# Patient Record
Sex: Male | Born: 1990 | Race: White | Hispanic: No | Marital: Single | Smoking: Never smoker
Health system: Southern US, Community
[De-identification: ages and names within clinical notes are randomized; demographics above are authoritative.]

## PROBLEM LIST (undated history)

## (undated) HISTORY — PX: WISDOM TOOTH EXTRACTION: SHX21

---
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
## 2009-06-01 ENCOUNTER — Emergency Department (HOSPITAL_COMMUNITY): Admission: EM | Admit: 2009-06-01 | Discharge: 2009-06-01 | Payer: Self-pay | Admitting: Family Medicine

## 2010-04-07 ENCOUNTER — Emergency Department (HOSPITAL_COMMUNITY): Admission: EM | Admit: 2010-04-07 | Discharge: 2010-04-07 | Payer: Self-pay | Admitting: Family Medicine

## 2015-02-04 ENCOUNTER — Encounter (HOSPITAL_COMMUNITY): Payer: Self-pay | Admitting: *Deleted

## 2015-02-04 ENCOUNTER — Emergency Department (HOSPITAL_COMMUNITY)
Admission: EM | Admit: 2015-02-04 | Discharge: 2015-02-05 | Disposition: A | Discharge status: Home / Self Care | Payer: BLUE CROSS/BLUE SHIELD | Attending: Emergency Medicine | Admitting: Emergency Medicine

## 2015-02-04 DIAGNOSIS — Y998 Other external cause status: Secondary | ICD-10-CM | POA: Insufficient documentation

## 2015-02-04 DIAGNOSIS — Y9339 Activity, other involving climbing, rappelling and jumping off: Secondary | ICD-10-CM | POA: Insufficient documentation

## 2015-02-04 DIAGNOSIS — S0181XA Laceration without foreign body of other part of head, initial encounter: Secondary | ICD-10-CM | POA: Diagnosis not present

## 2015-02-04 DIAGNOSIS — Y9289 Other specified places as the place of occurrence of the external cause: Secondary | ICD-10-CM | POA: Insufficient documentation

## 2015-02-04 DIAGNOSIS — W01198A Fall on same level from slipping, tripping and stumbling with subsequent striking against other object, initial encounter: Secondary | ICD-10-CM | POA: Diagnosis not present

## 2015-02-04 DIAGNOSIS — Z23 Encounter for immunization: Secondary | ICD-10-CM | POA: Insufficient documentation

## 2015-02-04 MED ORDER — TETANUS-DIPHTH-ACELL PERTUSSIS 5-2.5-18.5 LF-MCG/0.5 IM SUSP
0.5000 mL | Freq: Once | INTRAMUSCULAR | Status: AC
  Administered 2015-02-04: 0.5 mL via INTRAMUSCULAR
  Filled 2015-02-04: qty 0.5

## 2015-02-04 MED ORDER — LIDOCAINE-EPINEPHRINE 2 %-1:100000 IJ SOLN
20.0000 mL | Freq: Once | INTRAMUSCULAR | Status: AC
  Administered 2015-02-05: 1 mL
  Filled 2015-02-04: qty 1

## 2015-02-04 MED ORDER — IBUPROFEN 800 MG PO TABS: 800.0000 mg | ORAL_TABLET | Freq: Once | ORAL | Status: DC

## 2015-02-04 MED ORDER — BACITRACIN ZINC 500 UNIT/GM EX OINT: 1.0000 "application " | TOPICAL_OINTMENT | Freq: Two times a day (BID) | CUTANEOUS | Status: DC

## 2015-02-04 NOTE — ED Provider Notes (Signed)
CSN: [id number]     Arrival date & time 02/04/15  2205 History  This chart was scribed for non-physician practitioner, Antony MaduraKelly Omauri Boeve, PA-C working with Gerhard Munchobert Lockwood, MD, by Jarvis Morganaylor Ferguson, ED Scribe. This patient was seen in room WTR7/WTR7 and the patient's care was started at 10:39 PM.    Chief Complaint  Patient presents with  . Head Laceration   Patient is a 24 y.o. male presenting with scalp laceration. The history is provided by the patient. No language interpreter was used.  Head Laceration This is a new problem. The current episode started 1 to 2 hours ago. The problem occurs rarely. The problem has not changed since onset.Pertinent negatives include no headaches. Nothing aggravates the symptoms. Nothing relieves the symptoms. He has tried nothing for the symptoms.    HPI Comments: Jared Dickerson is a 24 y.o. male who presents to the Emergency Department complaining of a head laceration that occurred PTA. Pt states he was trying to get into his house and was climbing over a fence and he fell and cut the right side of his forehead on the top of the fence. Pt states his last tetanus was more than 5 years ago. He denies any fall. There is no active bleeding at this time. He denies any pain at this time.    History reviewed. No pertinent past medical history. Past Surgical History  Procedure Laterality Date  . Wisdom tooth extraction     No family history on file. History  Substance Use Topics  . Smoking status: Never Smoker   . Smokeless tobacco: Not on file  . Alcohol Use: Yes     Comment: occasional    Review of Systems  Skin: Positive for wound (right forehead).  Neurological: Negative for headaches.    Allergies  Review of patient's allergies indicates no known allergies.  Home Medications   Prior to Admission medications   Medication Sig Start Date End Date Taking? Authorizing Provider  bacitracin ointment Apply 1 application topically 2 (two) times daily. 02/04/15    Antony MaduraKelly Mattew Chriswell, PA-C   Triage Vitals: BP 149/73 mmHg  Pulse 73  Temp(Src) 98 F (36.7 C) (Oral)  Resp 18  SpO2 100%  Physical Exam  Constitutional: He is oriented to person, place, and time. He appears well-developed and well-nourished. No distress.  HENT:  Head: Normocephalic. Head is with contusion and with laceration. Head is without raccoon's eyes and without Battle's sign.    2.5 cm laceration to R forehead. Bleeding controlled.  Eyes: Conjunctivae and EOM are normal. Pupils are equal, round, and reactive to light. No scleral icterus.  Neck: Normal range of motion.  Pulmonary/Chest: Effort normal. No respiratory distress.  Musculoskeletal: Normal range of motion.  Neurological: He is alert and oriented to person, place, and time. No cranial nerve deficit. He exhibits normal muscle tone. Coordination normal.  GCS 15. No focal neurologic deficits appreciated. Patient ambulatory with steady gait.  Skin: Skin is warm and dry. No rash noted. He is not diaphoretic. No erythema. No pallor.  Psychiatric: He has a normal mood and affect. His behavior is normal.  Nursing note and vitals reviewed.   ED Course  Procedures (including critical care time)  DIAGNOSTIC STUDIES: Oxygen Saturation is 100% on RA, normal by my interpretation.    COORDINATION OF CARE: 10:49 PM  LACERATION REPAIR Performed by: Awilda BillErin Kimble, PA-S Consent: Verbal consent obtained. Risks and benefits: risks, benefits and alternatives were discussed Patient identity confirmed: provided demographic data Time out performed  prior to procedure Prepped and Draped in normal sterile fashion Wound explored Laceration Location: right forehead  Laceration Length: 2.5 cm No Foreign Bodies seen or palpated Anesthesia: local infiltration Local anesthetic: lidocaine 2 % w/ epinephrine Anesthetic total: 3 ml Irrigation method: syringe Amount of cleaning: standard Skin closure: 5-0 ethilon Number of sutures or staples:  5 Technique: simple interrupted Patient tolerance: Patient tolerated the procedure well with no immediate complications.  Labs Review Labs Reviewed - No data to display  Imaging Review No results found.   EKG Interpretation None      MDM   Final diagnoses:  Forehead laceration, initial encounter    Tdap booster given. Pressure irrigation performed. Laceration occurred < 8 hours prior to repair which was well tolerated. Pt has no comorbidities to effect normal wound healing. Discussed suture home care with pt and answered questions. Pt to follow up for wound check and suture removal in 5-7 days. Patient is hemodynamically stable with no complaints prior to discharge.    I personally performed the services described in this documentation, which was scribed in my presence. The recorded information has been reviewed and is accurate.   Filed Vitals:   02/04/15 2216  BP: 149/73  Pulse: 73  Temp: 98 F (36.7 C)  TempSrc: Oral  Resp: 18  SpO2: 100%       Antony Madura, PA-C 02/05/15 0154  Gerhard Munch, MD 02/08/15 1046

## 2015-02-04 NOTE — ED Notes (Signed)
Pt reports he jumped over a fence to get in his house tonight.  A metal piece was sticking out sticking him in his forehead.

## 2015-02-04 NOTE — Discharge Instructions (Signed)
Facial Laceration ° A facial laceration is a cut on the face. These injuries can be painful and cause bleeding. Lacerations usually heal quickly, but they need special care to reduce scarring. °DIAGNOSIS  °Your health care provider will take a medical history, ask for details about how the injury occurred, and examine the wound to determine how deep the cut is. °TREATMENT  °Some facial lacerations may not require closure. Others may not be able to be closed because of an increased risk of infection. The risk of infection and the chance for successful closure will depend on various factors, including the amount of time since the injury occurred. °The wound may be cleaned to help prevent infection. If closure is appropriate, pain medicines may be given if needed. Your health care provider will use stitches (sutures), wound glue (adhesive), or skin adhesive strips to repair the laceration. These tools bring the skin edges together to allow for faster healing and a better cosmetic outcome. If needed, you may also be given a tetanus shot. °HOME CARE INSTRUCTIONS °· Only take over-the-counter or prescription medicines as directed by your health care provider. °· Follow your health care provider's instructions for wound care. These instructions will vary depending on the technique used for closing the wound. °For Sutures: °· Keep the wound clean and dry.   °· If you were given a bandage (dressing), you should change it at least once a day. Also change the dressing if it becomes wet or dirty, or as directed by your health care provider.   °· Wash the wound with soap and water 2 times a day. Rinse the wound off with water to remove all soap. Pat the wound dry with a clean towel.   °· After cleaning, apply a thin layer of the antibiotic ointment recommended by your health care provider. This will help prevent infection and keep the dressing from sticking.   °· You may shower as usual after the first 24 hours. Do not soak the  wound in water until the sutures are removed.   °· Get your sutures removed as directed by your health care provider. With facial lacerations, sutures should usually be taken out after 4-5 days to avoid stitch marks.   °· Wait a few days after your sutures are removed before applying any makeup. °For Skin Adhesive Strips: °· Keep the wound clean and dry.   °· Do not get the skin adhesive strips wet. You may bathe carefully, using caution to keep the wound dry.   °· If the wound gets wet, pat it dry with a clean towel.   °· Skin adhesive strips will fall off on their own. You may trim the strips as the wound heals. Do not remove skin adhesive strips that are still stuck to the wound. They will fall off in time.   °For Wound Adhesive: °· You may briefly wet your wound in the shower or bath. Do not soak or scrub the wound. Do not swim. Avoid periods of heavy sweating until the skin adhesive has fallen off on its own. After showering or bathing, gently pat the wound dry with a clean towel.   °· Do not apply liquid medicine, cream medicine, ointment medicine, or makeup to your wound while the skin adhesive is in place. This may loosen the film before your wound is healed.   °· If a dressing is placed over the wound, be careful not to apply tape directly over the skin adhesive. This may cause the adhesive to be pulled off before the wound is healed.   °· Avoid   prolonged exposure to sunlight or tanning lamps while the skin adhesive is in place.  The skin adhesive will usually remain in place for 5-10 days, then naturally fall off the skin. Do not pick at the adhesive film.  After Healing: Once the wound has healed, cover the wound with sunscreen during the day for 1 full year. This can help minimize scarring. Exposure to ultraviolet light in the first year will darken the scar. It can take 1-2 years for the scar to lose its redness and to heal completely.  SEEK IMMEDIATE MEDICAL CARE IF:  You have redness, pain, or  swelling around the wound.   You see ayellowish-white fluid (pus) coming from the wound.   You have chills or a fever.  MAKE SURE YOU:  Understand these instructions.  Will watch your condition.  Will get help right away if you are not doing well or get worse. Document Released: 11/16/2004 Document Revised: 07/30/2013 Document Reviewed: 05/22/2013 [facility] Patient Information 2015 [employer], [place]. This information is not intended to replace advice given to you by your health care provider. Make sure you discuss any questions you have with your health care provider.  Laceration Care, Adult A laceration is a cut or lesion that goes through all layers of the skin and into the tissue just beneath the skin. TREATMENT  Some lacerations may not require closure. Some lacerations may not be able to be closed due to an increased risk of infection. It is important to see your caregiver as soon as possible after an injury to minimize the risk of infection and maximize the opportunity for successful closure. If closure is appropriate, pain medicines may be given, if needed. The wound will be cleaned to help prevent infection. Your caregiver will use stitches (sutures), staples, wound glue (adhesive), or skin adhesive strips to repair the laceration. These tools bring the skin edges together to allow for faster healing and a better cosmetic outcome. However, all wounds will heal with a scar. Once the wound has healed, scarring can be minimized by covering the wound with sunscreen during the day for 1 full year. HOME CARE INSTRUCTIONS  For sutures or staples:  Keep the wound clean and dry.  If you were given a bandage (dressing), you should change it at least once a day. Also, change the dressing if it becomes wet or dirty, or as directed by your caregiver.  Wash the wound with soap and water 2 times a day. Rinse the wound off with water to remove all soap. Pat the wound dry with a clean  towel.  After cleaning, apply a thin layer of the antibiotic ointment as recommended by your caregiver. This will help prevent infection and keep the dressing from sticking.  You may shower as usual after the first 24 hours. Do not soak the wound in water until the sutures are removed.  Only take over-the-counter or prescription medicines for pain, discomfort, or fever as directed by your caregiver.  Get your sutures or staples removed as directed by your caregiver. For skin adhesive strips:  Keep the wound clean and dry.  Do not get the skin adhesive strips wet. You may bathe carefully, using caution to keep the wound dry.  If the wound gets wet, pat it dry with a clean towel.  Skin adhesive strips will fall off on their own. You may trim the strips as the wound heals. Do not remove skin adhesive strips that are still stuck to the wound. They will fall  off in time. For wound adhesive:  You may briefly wet your wound in the shower or bath. Do not soak or scrub the wound. Do not swim. Avoid periods of heavy perspiration until the skin adhesive has fallen off on its own. After showering or bathing, gently pat the wound dry with a clean towel.  Do not apply liquid medicine, cream medicine, or ointment medicine to your wound while the skin adhesive is in place. This may loosen the film before your wound is healed.  If a dressing is placed over the wound, be careful not to apply tape directly over the skin adhesive. This may cause the adhesive to be pulled off before the wound is healed.  Avoid prolonged exposure to sunlight or tanning lamps while the skin adhesive is in place. Exposure to ultraviolet light in the first year will darken the scar.  The skin adhesive will usually remain in place for 5 to 10 days, then naturally fall off the skin. Do not pick at the adhesive film. You may need a tetanus shot if:  You cannot remember when you had your last tetanus shot.  You have never had a  tetanus shot. If you get a tetanus shot, your arm may swell, get red, and feel warm to the touch. This is common and not a problem. If you need a tetanus shot and you choose not to have one, there is a rare chance of getting tetanus. Sickness from tetanus can be serious. SEEK MEDICAL CARE IF:   You have redness, swelling, or increasing pain in the wound.  You see a red line that goes away from the wound.  You have yellowish-white fluid (pus) coming from the wound.  You have a fever.  You notice a bad smell coming from the wound or dressing.  Your wound breaks open before or after sutures have been removed.  You notice something coming out of the wound such as wood or glass.  Your wound is on your hand or foot and you cannot move a finger or toe. SEEK IMMEDIATE MEDICAL CARE IF:   Your pain is not controlled with prescribed medicine.  You have severe swelling around the wound causing pain and numbness or a change in color in your arm, hand, leg, or foot.  Your wound splits open and starts bleeding.  You have worsening numbness, weakness, or loss of function of any joint around or beyond the wound.  You develop painful lumps near the wound or on the skin anywhere on your body. MAKE SURE YOU:   Understand these instructions.  Will watch your condition.  Will get help right away if you are not doing well or get worse. Document Released: 10/09/2005 Document Revised: 01/01/2012 Document Reviewed: 04/04/2011 [facility] Patient Information 2015 [employer], [place]. This information is not intended to replace advice given to you by your health care provider. Make sure you discuss any questions you have with your health care provider.

## 2015-06-24 ENCOUNTER — Encounter: Payer: Self-pay | Admitting: Physician Assistant

## 2015-06-24 ENCOUNTER — Ambulatory Visit (INDEPENDENT_AMBULATORY_CARE_PROVIDER_SITE_OTHER): Payer: BLUE CROSS/BLUE SHIELD | Admitting: Physician Assistant

## 2015-06-24 VITALS — BP 131/81 | HR 58 | Temp 98.3°F | Ht 73.0 in | Wt 168.0 lb

## 2015-06-24 DIAGNOSIS — S0181XA Laceration without foreign body of other part of head, initial encounter: Secondary | ICD-10-CM

## 2015-06-24 NOTE — Patient Instructions (Signed)
Tissue Adhesive Wound Care  Some cuts and wounds can be closed with tissue adhesive. Adhesive is like glue. It holds the skin together and helps a wound heal faster. This adhesive goes away on its own as the wound heals.   HOME CARE    Showers are allowed. Do not soak the wound in water. Do not take baths, swim, or use hot tubs. Do not use soaps or creams on your wound.   If a bandage (dressing) was put on, change it as often as told by your doctor.   Keep the bandage dry.   Do not scratch, pick, or rub the adhesive.   Do not put tape over the adhesive. The adhesive could come off.   Protect the wound from another injury.   Protect the wound from sun and tanning beds.   Only take medicine as told by your doctor.   Keep all doctor visits as told.  GET HELP RIGHT AWAY IF:    Your wound is red, puffy (swollen), hot, or tender.   You get a rash after the glue is put on.   You have more pain in the wound.   You have a red streak going away from the wound.   You have yellowish-white fluid (pus) coming from the wound.   You have more bleeding.   You have a fever.   You have chills and start to shake.   You notice a bad smell coming from the wound.   Your wound or adhesive breaks open.  MAKE SURE YOU:    Understand these instructions.   Will watch your condition.   Will get help right away if you are not doing well or get worse.  Document Released: 07/18/2008 Document Revised: 07/30/2013 Document Reviewed: 04/30/2013  [employer] Patient Information 2015 [employer]. This information is not intended to replace advice given to you by your health care provider. Make sure you discuss any questions you have with your health care provider.

## 2015-06-24 NOTE — Progress Notes (Signed)
   Subjective:    Patient ID: Jared Dickerson, male    DOB: 21-Jan-1991, 24 y.o.   MRN: [id number]  HPI 24 y/o male presents with c/o laceration on chin that occurred while wake boarding yesterday. No co-morbidities.     Review of Systems  Constitutional: Negative.   HENT: Negative.   Eyes: Negative.   Respiratory: Negative.   Cardiovascular: Negative.   Gastrointestinal: Negative.   Endocrine: Negative.   Genitourinary: Negative.   Musculoskeletal: Negative.   Skin: Positive for wound (2 inch laceration on submental region of chin ).       Objective:   Physical Exam  Skin:  2 inch clean laceration on submental region of chin. Edges are in line. No missing tissue.  Minimal erythema and edema           Assessment & Plan:  1. Laceration of chin, initial encounter - Cleaned and dermabond applied in office. Instructions were given on dermabond care. Patient will f/u if problems arise   RTO prn   Belissa Kooy A. Chauncey Reading PA-C

## 2015-07-11 ENCOUNTER — Encounter: Payer: Self-pay | Admitting: Physician Assistant

## 2015-09-21 ENCOUNTER — Ambulatory Visit (INDEPENDENT_AMBULATORY_CARE_PROVIDER_SITE_OTHER): Payer: BLUE CROSS/BLUE SHIELD | Admitting: Family

## 2015-09-21 ENCOUNTER — Ambulatory Visit (INDEPENDENT_AMBULATORY_CARE_PROVIDER_SITE_OTHER): Payer: BLUE CROSS/BLUE SHIELD

## 2015-09-21 ENCOUNTER — Encounter: Payer: Self-pay | Admitting: Family

## 2015-09-21 VITALS — BP 137/81 | HR 72 | Temp 98.1°F | Ht 73.0 in | Wt 172.0 lb

## 2015-09-21 DIAGNOSIS — M25512 Pain in left shoulder: Secondary | ICD-10-CM | POA: Diagnosis not present

## 2015-09-21 DIAGNOSIS — S43102A Unspecified dislocation of left acromioclavicular joint, initial encounter: Secondary | ICD-10-CM
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
## 2015-09-21 NOTE — Patient Instructions (Signed)
Acromioclavicular Separation With Rehab The acromioclavicular joint is the joint between the roof of the shoulder (acromion) and the collarbone (clavicle). It is vulnerable to injury. An acromioclavicular [facility]) separation is a partial or complete tear (sprain), injury, or redness and soreness (inflammation) of the ligaments that cross the acromioclavicular joint and hold it in place. There are two ligaments in this area that are vulnerable to injury, the acromioclavicular ligament and the coracoclavicular ligament. SYMPTOMS   Tenderness and swelling, or a bump on top of the shoulder (at the [facility] joint).  Bruising (contusion) in the area within 48 hours of injury.  Loss of strength or pain when reaching over the head or across the body. CAUSES  AC separation is caused by direct trauma to the joint (falling on your shoulder) or indirect trauma (falling on an outstretched arm). RISK INCREASES WITH:  Sports that require contact or collision, throwing sports (i.e. racquetball, squash).  Poor strength and flexibility.  Previous shoulder sprain or dislocation.  Poorly fitted or padded protective equipment. PREVENTION   Warm-up and stretch properly before activity.  Maintain physical fitness:  Shoulder strength.  Shoulder flexibility.  Cardiovascular fitness.  Wear properly fitted and padded protective equipment.  Learn and use proper technique when playing sports. Have a coach correct improper technique, including falling and landing.  Apply taping, protective strapping or padding, or an adhesive bandage as recommended before practice or competition. PROGNOSIS   If treated properly, the symptoms of AC separation can be expected to go away.  If treated improperly, permanent disability may occur unless surgery is performed.  Healing time varies with type of sport and position, arm injured (dominant versus non-dominant) and severity of sprain. RELATED COMPLICATIONS  Weakness and  fatigue of the arm or shoulder are possible but uncommon.  Pain and inflammation of the [facility] joint may continue.  Prolonged healing time may be necessary if usual activities are resumed too early. This causes a susceptibility to recurrent injury.  Prolonged disability may occur.  The shoulder may remain unstable or arthritic following repeated injury. TREATMENT  Treatment initially involves ice and medication to help reduce pain and inflammation. It may also be necessary to modify your activities in order to prevent further injury. Both non-surgical and surgical interventions exist to treat AC separation. Non-surgical intervention is usually recommended and involves wearing a sling to immobilize the joint for a period of time to allow for healing. Surgical intervention is usually only considered for severe sprains of the ligament or for individuals who do not improve after 2 to 6 months of non-surgical treatment. Surgical interventions require 4 to 6 months before a return to sports is possible. MEDICATION  If pain medication is necessary, nonsteroidal anti-inflammatory medications, such as aspirin and ibuprofen, or other minor pain relievers, such as acetaminophen, are often recommended.  Do not take pain medication for 7 days before surgery.  Prescription pain relievers may be given by your caregiver. Use only as directed and only as much as you need.  Ointments applied to the skin may be helpful.  Corticosteroid injections may be given to reduce inflammation. HEAT AND COLD  Cold treatment (icing) relieves pain and reduces inflammation. Cold treatment should be applied for 10 to 15 minutes every 2 to 3 hours for inflammation and pain and immediately after any activity that aggravates your symptoms. Use ice packs or an ice massage.  Heat treatment may be used prior to performing the stretching and strengthening activities prescribed by your caregiver, physical therapist or  athletic  trainer. Use a heat pack or a warm soak. SEEK IMMEDIATE MEDICAL CARE IF:   Pain, swelling or bruising worsens despite treatment.  There is pain, numbness or coldness in the arm.  Discoloration appears in the fingernails.  New, unexplained symptoms develop. EXERCISES  RANGE OF MOTION (ROM) AND STRETCHING EXERCISES - Acromioclavicular Separation These exercises may help you when beginning to rehabilitate your injury. Your symptoms may resolve with or without further involvement from your physician, physical therapist or athletic trainer. While completing these exercises, remember:  Restoring tissue flexibility helps normal motion to return to the joints. This allows healthier, less painful movement and activity.  An effective stretch should be held for at least 30 seconds.  A stretch should never be painful. You should only feel a gentle lengthening or release in the stretched tissue. ROM - Pendulum  Bend at the waist so that your right / left arm falls away from your body. Support yourself with your opposite hand on a solid surface, such as a table or a countertop.  Your right / left arm should be perpendicular to the ground. If it is not perpendicular, you need to lean over farther. Relax the muscles in your right / left arm and shoulder as much as possible.  Gently sway your hips and trunk so they move your right / left arm without any use of your right / left shoulder muscles.  Progress your movements so that your right / left arm moves side to side, then forward and backward, and finally, both clockwise and counterclockwise.  Complete __________ repetitions in each direction. Many people use this exercise to relieve discomfort in their shoulder as well as to gain range of motion. Repeat __________ times. Complete this exercise __________ times per day. STRETCH - Flexion, Seated   Sit in a firm chair so that your right / left forearm can rest on a table or countertop. Your right /  left elbow should rest below the height of your shoulder so that your shoulder feels supported and not tense or uncomfortable.  Keeping your right / left shoulder relaxed, lean forward at your waist, allowing your right / left hand to slide forward. Bend forward until you feel a moderate stretch in your shoulder, but before you feel an increase in your pain.  Hold __________ seconds. Slowly return to your starting position. Repeat __________ times. Complete this exercise __________ times per day. STRETCH - Flexion, Standing  Stand with good posture. With an underhand grip on your right / left and an overhand grip on the opposite hand, grasp a broomstick or cane so that your hands are a little more than shoulder-width apart.  Keeping your right / left elbow straight and shoulder muscles relaxed, push the stick with your opposite hand to raise your right / left arm in front of your body and then overhead. Raise your arm until you feel a stretch in your right / left shoulder, but before you have increased shoulder pain.  Try to avoid shrugging your right / left shoulder as your arm rises by keeping your shoulder blade tucked down and toward your mid-back spine. Hold __________ seconds.  Slowly return to the starting position. Repeat __________ times. Complete this exercise __________ times per day. STRENGTHENING EXERCISES - Acromioclavicular Separation These exercises may help you when beginning to rehabilitate your injury. They may resolve your symptoms with or without further involvement from your physician, physical therapist or athletic trainer. While completing these exercises, remember:  Muscles   can gain both the endurance and the strength needed for everyday activities through controlled exercises.  Complete these exercises as instructed by your physician, physical therapist or athletic trainer. Progress the resistance and repetitions only as guided.  You may experience muscle soreness or  fatigue, but the pain or discomfort you are trying to eliminate should never worsen during these exercises. If this pain does worsen, stop and make certain you are following the directions exactly. If the pain is still present after adjustments, discontinue the exercise until you can discuss the trouble with your clinician. STRENGTH - Shoulder Abductors, Isometric   With good posture, stand or sit about 4-6 inches from a wall with your right / left side facing the wall.  Bend your right / left elbow. Gently press your right / left elbow into the wall. Increase the pressure gradually until you are pressing as hard as you can without shrugging your shoulder or increasing any shoulder discomfort.  Hold __________ seconds.  Release the tension slowly. Relax your shoulder muscles completely before you start the next repetition. Repeat __________ times. Complete this exercise __________ times per day. STRENGTH - Internal Rotators, Isometric  Keep your right / left elbow at your side and bend it 90 degrees.  Step into a door frame so that the inside of your right / left wrist can press against the door frame without your upper arm leaving your side.  Gently press your right / left wrist into the door frame as if you were trying to draw the palm of your hand to your abdomen. Gradually increase the tension until you are pressing as hard as you can without shrugging your shoulder or increasing any shoulder discomfort.  Hold __________ seconds.  Release the tension slowly. Relax your shoulder muscles completely before you the next repetition. Repeat __________ times. Complete this exercise __________ times per day.  STRENGTH - External Rotators, Isometric  Keep your right / left elbow at your side and bend it 90 degrees.  Step into a door frame so that the outside of your right / left wrist can press against the door frame without your upper arm leaving your side.  Gently press your right / left  wrist into the door frame as if you were trying to swing the back of your hand away from your abdomen. Gradually increase the tension until you are pressing as hard as you can without shrugging your shoulder or increasing any shoulder discomfort.  Hold __________ seconds.  Release the tension slowly. Relax your shoulder muscles completely before you the next repetition. Repeat __________ times. Complete this exercise __________ times per day. STRENGTH - Internal Rotators  Secure a rubber exercise band/tubing to a fixed object so that it is at the same height as your right / left elbow when you are standing or sitting on a firm surface.  Stand or sit so that the secured exercise band/tubing is at your right / left side.  Bend your elbow 90 degrees. Place a folded towel or small pillow under your right / left arm so that your elbow is a few inches away from your side.  Keeping the tension on the exercise band/tubing, pull it across your body toward your abdomen. Be sure to keep your body steady so that the movement is only coming from your shoulder rotating.  Hold __________ seconds. Release the tension in a controlled manner as you return to the starting position. Repeat __________ times. Complete this exercise __________ times per day. STRENGTH -   External Rotators  Secure a rubber exercise band/tubing to a fixed object so that it is at the same height as your right / left elbow when you are standing or sitting on a firm surface.  Stand or sit so that the secured exercise band/tubing is at your side that is not injured.  Bend your elbow 90 degrees. Place a folded towel or small pillow under your right / left arm so that your elbow is a few inches away from your side.  Keeping the tension on the exercise band/tubing, pull it away from your body, as if pivoting on your elbow. Be sure to keep your body steady so that the movement is only coming from your shoulder rotating.  Hold __________  seconds. Release the tension in a controlled manner as you return to the starting position. Repeat __________ times. Complete this exercise __________ times per day.   This information is not intended to replace advice given to you by your health care provider. Make sure you discuss any questions you have with your health care provider.   Document Released: 10/09/2005 Document Revised: 10/30/2014 Document Reviewed: 01/21/2009 Elsevier Interactive Patient Education 2016 [employer] Shoulder Separation A shoulder separation (acromioclavicular separation) is an injury to the connecting tissue (ligament) between the top of your shoulder blade (acromion) and your collarbone (clavicle). The ligament may be stretched, partially torn, or completely torn.  A stretched ligament may not cause very much pain, and it does not move the collarbone out of place. A stretched ligament looks normal on an X-ray.  An injury that is a bit worse may partially tear a ligament and move the collarbone slightly out of place.  A serious injury completely tears both shoulder ligaments. This moves the collarbone severely out of position and changes the way that the shoulder looks (deformity). CAUSES The most common cause of a shoulder separation is falling on or receiving a blow to the top of the shoulder. Falling with an outstretched arm may also cause this injury. RISK FACTORS You may be at greater risk of a shoulder separation if:  You are male.  You are younger than age 25.  You play a contact sport, such as football or hockey. SIGNS AND SYMPTOMS The most common symptom of a shoulder separation is pain on the top of the shoulder after falling on it or receiving a blow to it. Other signs and symptoms include:  Shoulder deformity.  Swelling of the shoulder.  Decreased ability to move the shoulder.  Bruising on top of the shoulder. DIAGNOSIS Your health care provider may suspect a shoulder separation  based on your symptoms and the details of a recent injury. A physical exam will be done. During this exam, the health care provider may:  Press on your shoulder.  Test the movement of your shoulder.  Ask you to hold a weight in your hand to see if the separation increases.  Do an X-ray. TREATMENT  A stretch injury may require only a sling, pain medicine, and cold packs. This treatment may last for 2-12 weeks. You may also have physical therapy. A physical therapist will teach you to do daily exercises to strengthen your shoulder muscles and prevent stiffness.  A complete tear may require surgery to repair the torn ligament. After surgery, you will also require a sling, pain medicine, and cold packs. Recovery may take longer. You may also need more physical therapy. HOME CARE INSTRUCTIONS  Take medicines only as directed by your health care provider.  Apply ice to the top of your shoulder:  Put ice in a plastic bag.  Place a towel between your skin and the bag.  Leave the ice on for 20 minutes, 2-3 times a day.  Wear your sling or splint as directed by your health care provider.  You may be able to remove your sling to do your physical therapy exercises.  Ask your health care provider when you can stop wearing the sling.  Do not do any activities that make your pain worse.  Do not lift anything that is heavier than 10 lb (4.5 kg) on the injured side of your body.  Ask your health care provider when you can return to athletic activities. SEEK MEDICAL CARE IF:  Your pain medicine is not relieving your pain.  Your pain and stiffness are not improving after 2 weeks.  You are unable to do your physical therapy exercises because of pain or stiffness.   This information is not intended to replace advice given to you by your health care provider. Make sure you discuss any questions you have with your health care provider.   Document Released: 07/19/2005 Document Revised:  10/30/2014 Document Reviewed: 03/11/2014 Elsevier Interactive Patient Education [employer]

## 2015-09-22 ENCOUNTER — Telehealth: Payer: Self-pay | Admitting: Family

## 2015-11-16 ENCOUNTER — Encounter: Payer: Self-pay | Admitting: Family

## 2015-11-16 ENCOUNTER — Ambulatory Visit (INDEPENDENT_AMBULATORY_CARE_PROVIDER_SITE_OTHER): Payer: BLUE CROSS/BLUE SHIELD | Admitting: Family

## 2015-11-16 DIAGNOSIS — M542 Cervicalgia: Secondary | ICD-10-CM

## 2015-11-16 DIAGNOSIS — M5442 Lumbago with sciatica, left side: Secondary | ICD-10-CM | POA: Diagnosis not present

## 2015-11-16 MED ORDER — NAPROXEN 500 MG PO TABS: 500.0000 mg | ORAL_TABLET | Freq: Two times a day (BID) | ORAL | Status: DC

## 2015-11-16 MED ORDER — CYCLOBENZAPRINE HCL 10 MG PO TABS
10.0000 mg | ORAL_TABLET | Freq: Three times a day (TID) | ORAL | Status: AC | PRN
Start: 2015-11-16 — End: ?
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
## 2015-11-16 NOTE — Patient Instructions (Addendum)
Sciatica With Rehab The sciatic nerve runs from the back down the leg and is responsible for sensation and control of the muscles in the back (posterior) side of the thigh, lower leg, and foot. Sciatica is a condition that is characterized by inflammation of this nerve.  SYMPTOMS   Signs of nerve damage, including numbness and/or weakness along the posterior side of the lower extremity.  Pain in the back of the thigh that may also travel down the leg.  Pain that worsens when sitting for long periods of time.  Occasionally, pain in the back or buttock. CAUSES  Inflammation of the sciatic nerve is the cause of sciatica. The inflammation is due to something irritating the nerve. Common sources of irritation include:  Sitting for long periods of time.  Direct trauma to the nerve.  Arthritis of the spine.  Herniated or ruptured disk.  Slipping of the vertebrae (spondylolisthesis).  Pressure from soft tissues, such as muscles or ligament-like tissue (fascia). RISK INCREASES WITH:  Sports that place pressure or stress on the spine (football or weightlifting).  Poor strength and flexibility.  Failure to warm up properly before activity.  Family history of low back pain or disk disorders.  Previous back injury or surgery.  Poor body mechanics, especially when lifting, or poor posture. PREVENTION   Warm up and stretch properly before activity.  Maintain physical fitness:  Strength, flexibility, and endurance.  Cardiovascular fitness.  Learn and use proper technique, especially with posture and lifting. When possible, have coach correct improper technique.  Avoid activities that place stress on the spine. PROGNOSIS If treated properly, then sciatica usually resolves within 6 weeks. However, occasionally surgery is necessary.  RELATED COMPLICATIONS   Permanent nerve damage, including pain, numbness, tingle, or weakness.  Chronic back pain.  Risks of surgery: infection,  bleeding, nerve damage, or damage to surrounding tissues. TREATMENT Treatment initially involves resting from any activities that aggravate your symptoms. The use of ice and medication may help reduce pain and inflammation. The use of strengthening and stretching exercises may help reduce pain with activity. These exercises may be performed at home or with referral to a therapist. A therapist may recommend further treatments, such as transcutaneous electronic nerve stimulation (TENS) or ultrasound. Your caregiver may recommend corticosteroid injections to help reduce inflammation of the sciatic nerve. If symptoms persist despite non-surgical (conservative) treatment, then surgery may be recommended. MEDICATION  If pain medication is necessary, then nonsteroidal anti-inflammatory medications, such as aspirin and ibuprofen, or other minor pain relievers, such as acetaminophen, are often recommended.  Do not take pain medication for 7 days before surgery.  Prescription pain relievers may be given if deemed necessary by your caregiver. Use only as directed and only as much as you need.  Ointments applied to the skin may be helpful.  Corticosteroid injections may be given by your caregiver. These injections should be reserved for the most serious cases, because they may only be given a certain number of times. HEAT AND COLD  Cold treatment (icing) relieves pain and reduces inflammation. Cold treatment should be applied for 10 to 15 minutes every 2 to 3 hours for inflammation and pain and immediately after any activity that aggravates your symptoms. Use ice packs or massage the area with a piece of ice (ice massage).  Heat treatment may be used prior to performing the stretching and strengthening activities prescribed by your caregiver, physical therapist, or athletic trainer. Use a heat pack or soak the injury in warm water.   SEEK MEDICAL CARE IF:  Treatment seems to offer no benefit, or the condition  worsens.  Any medications produce adverse side effects. EXERCISES  RANGE OF MOTION (ROM) AND STRETCHING EXERCISES - Sciatica Most people with sciatic will find that their symptoms worsen with either excessive bending forward (flexion) or arching at the low back (extension). The exercises which will help resolve your symptoms will focus on the opposite motion. Your physician, physical therapist or athletic trainer will help you determine which exercises will be most helpful to resolve your low back pain. Do not complete any exercises without first consulting with your clinician. Discontinue any exercises which worsen your symptoms until you speak to your clinician. If you have pain, numbness or tingling which travels down into your buttocks, leg or foot, the goal of the therapy is for these symptoms to move closer to your back and eventually resolve. Occasionally, these leg symptoms will get better, but your low back pain may worsen; this is typically an indication of progress in your rehabilitation. Be certain to be very alert to any changes in your symptoms and the activities in which you participated in the 24 hours prior to the change. Sharing this information with your clinician will allow him/her to most efficiently treat your condition. These exercises may help you when beginning to rehabilitate your injury. Your symptoms may resolve with or without further involvement from your physician, physical therapist or athletic trainer. While completing these exercises, remember:   Restoring tissue flexibility helps normal motion to return to the joints. This allows healthier, less painful movement and activity.  An effective stretch should be held for at least 30 seconds.  A stretch should never be painful. You should only feel a gentle lengthening or release in the stretched tissue. FLEXION RANGE OF MOTION AND STRETCHING EXERCISES: STRETCH - Flexion, Single Knee to Chest   Lie on a firm bed or floor  with both legs extended in front of you.  Keeping one leg in contact with the floor, bring your opposite knee to your chest. Hold your leg in place by either grabbing behind your thigh or at your knee.  Pull until you feel a gentle stretch in your low back. Hold __________ seconds.  Slowly release your grasp and repeat the exercise with the opposite side. Repeat __________ times. Complete this exercise __________ times per day.  STRETCH - Flexion, Double Knee to Chest  Lie on a firm bed or floor with both legs extended in front of you.  Keeping one leg in contact with the floor, bring your opposite knee to your chest.  Tense your stomach muscles to support your back and then lift your other knee to your chest. Hold your legs in place by either grabbing behind your thighs or at your knees.  Pull both knees toward your chest until you feel a gentle stretch in your low back. Hold __________ seconds.  Tense your stomach muscles and slowly return one leg at a time to the floor. Repeat __________ times. Complete this exercise __________ times per day.  STRETCH - Low Trunk Rotation   Lie on a firm bed or floor. Keeping your legs in front of you, bend your knees so they are both pointed toward the ceiling and your feet are flat on the floor.  Extend your arms out to the side. This will stabilize your upper body by keeping your shoulders in contact with the floor.  Gently and slowly drop both knees together to one side until   you feel a gentle stretch in your low back. Hold for __________ seconds.  Tense your stomach muscles to support your low back as you bring your knees back to the starting position. Repeat the exercise to the other side. Repeat __________ times. Complete this exercise __________ times per day  EXTENSION RANGE OF MOTION AND FLEXIBILITY EXERCISES: STRETCH - Extension, Prone on Elbows  Lie on your stomach on the floor, a bed will be too soft. Place your palms about shoulder  width apart and at the height of your head.  Place your elbows under your shoulders. If this is too painful, stack pillows under your chest.  Allow your body to relax so that your hips drop lower and make contact more completely with the floor.  Hold this position for __________ seconds.  Slowly return to lying flat on the floor. Repeat __________ times. Complete this exercise __________ times per day.  RANGE OF MOTION - Extension, Prone Press Ups  Lie on your stomach on the floor, a bed will be too soft. Place your palms about shoulder width apart and at the height of your head.  Keeping your back as relaxed as possible, slowly straighten your elbows while keeping your hips on the floor. You may adjust the placement of your hands to maximize your comfort. As you gain motion, your hands will come more underneath your shoulders.  Hold this position __________ seconds.  Slowly return to lying flat on the floor. Repeat __________ times. Complete this exercise __________ times per day.  STRENGTHENING EXERCISES - Sciatica  These exercises may help you when beginning to rehabilitate your injury. These exercises should be done near your "sweet spot." This is the neutral, low-back arch, somewhere between fully rounded and fully arched, that is your least painful position. When performed in this safe range of motion, these exercises can be used for people who have either a flexion or extension based injury. These exercises may resolve your symptoms with or without further involvement from your physician, physical therapist or athletic trainer. While completing these exercises, remember:   Muscles can gain both the endurance and the strength needed for everyday activities through controlled exercises.  Complete these exercises as instructed by your physician, physical therapist or athletic trainer. Progress with the resistance and repetition exercises only as your caregiver advises.  You may  experience muscle soreness or fatigue, but the pain or discomfort you are trying to eliminate should never worsen during these exercises. If this pain does worsen, stop and make certain you are following the directions exactly. If the pain is still present after adjustments, discontinue the exercise until you can discuss the trouble with your clinician. STRENGTHENING - Deep Abdominals, Pelvic Tilt   Lie on a firm bed or floor. Keeping your legs in front of you, bend your knees so they are both pointed toward the ceiling and your feet are flat on the floor.  Tense your lower abdominal muscles to press your low back into the floor. This motion will rotate your pelvis so that your tail bone is scooping upwards rather than pointing at your feet or into the floor.  With a gentle tension and even breathing, hold this position for __________ seconds. Repeat __________ times. Complete this exercise __________ times per day.  STRENGTHENING - Abdominals, Crunches   Lie on a firm bed or floor. Keeping your legs in front of you, bend your knees so they are both pointed toward the ceiling and your feet are flat on the   floor. Cross your arms over your chest.  Slightly tip your chin down without bending your neck.  Tense your abdominals and slowly lift your trunk high enough to just clear your shoulder blades. Lifting higher can put excessive stress on the low back and does not further strengthen your abdominal muscles.  Control your return to the starting position. Repeat __________ times. Complete this exercise __________ times per day.  STRENGTHENING - Quadruped, Opposite UE/LE Lift  Assume a hands and knees position on a firm surface. Keep your hands under your shoulders and your knees under your hips. You may place padding under your knees for comfort.  Find your neutral spine and gently tense your abdominal muscles so that you can maintain this position. Your shoulders and hips should form a rectangle  that is parallel with the floor and is not twisted.  Keeping your trunk steady, lift your right hand no higher than your shoulder and then your left leg no higher than your hip. Make sure you are not holding your breath. Hold this position __________ seconds.  Continuing to keep your abdominal muscles tense and your back steady, slowly return to your starting position. Repeat with the opposite arm and leg. Repeat __________ times. Complete this exercise __________ times per day.  STRENGTHENING - Abdominals and Quadriceps, Straight Leg Raise   Lie on a firm bed or floor with both legs extended in front of you.  Keeping one leg in contact with the floor, bend the other knee so that your foot can rest flat on the floor.  Find your neutral spine, and tense your abdominal muscles to maintain your spinal position throughout the exercise.  Slowly lift your straight leg off the floor about 6 inches for a count of 15, making sure to not hold your breath.  Still keeping your neutral spine, slowly lower your leg all the way to the floor. Repeat this exercise with each leg __________ times. Complete this exercise __________ times per day. POSTURE AND BODY MECHANICS CONSIDERATIONS - Sciatica Keeping correct posture when sitting, standing or completing your activities will reduce the stress put on different body tissues, allowing injured tissues a chance to heal and limiting painful experiences. The following are general guidelines for improved posture. Your physician or physical therapist will provide you with any instructions specific to your needs. While reading these guidelines, remember:  The exercises prescribed by your provider will help you have the flexibility and strength to maintain correct postures.  The correct posture provides the optimal environment for your joints to work. All of your joints have less wear and tear when properly supported by a spine with good posture. This means you will  experience a healthier, less painful body.  Correct posture must be practiced with all of your activities, especially prolonged sitting and standing. Correct posture is as important when doing repetitive low-stress activities (typing) as it is when doing a single heavy-load activity (lifting). RESTING POSITIONS Consider which positions are most painful for you when choosing a resting position. If you have pain with flexion-based activities (sitting, bending, stooping, squatting), choose a position that allows you to rest in a less flexed posture. You would want to avoid curling into a fetal position on your side. If your pain worsens with extension-based activities (prolonged standing, working overhead), avoid resting in an extended position such as sleeping on your stomach. Most people will find more comfort when they rest with their spine in a more neutral position, neither too rounded nor too   arched. Lying on a non-sagging bed on your side with a pillow between your knees, or on your back with a pillow under your knees will often provide some relief. Keep in mind, being in any one position for a prolonged period of time, no matter how correct your posture, can still lead to stiffness. PROPER SITTING POSTURE In order to minimize stress and discomfort on your spine, you must sit with correct posture Sitting with good posture should be effortless for a healthy body. Returning to good posture is a gradual process. Many people can work toward this most comfortably by using various supports until they have the flexibility and strength to maintain this posture on their own. When sitting with proper posture, your ears will fall over your shoulders and your shoulders will fall over your hips. You should use the back of the chair to support your upper back. Your low back will be in a neutral position, just slightly arched. You may place a small pillow or folded towel at the base of your low back for support.  When  working at a desk, create an environment that supports good, upright posture. Without extra support, muscles fatigue and lead to excessive strain on joints and other tissues. Keep these recommendations in mind: CHAIR:   A chair should be able to slide under your desk when your back makes contact with the back of the chair. This allows you to work closely.  The chair's height should allow your eyes to be level with the upper part of your monitor and your hands to be slightly lower than your elbows. BODY POSITION  Your feet should make contact with the floor. If this is not possible, use a foot rest.  Keep your ears over your shoulders. This will reduce stress on your neck and low back. INCORRECT SITTING POSTURES   If you are feeling tired and unable to assume a healthy sitting posture, do not slouch or slump. This puts excessive strain on your back tissues, causing more damage and pain. Healthier options include:  Using more support, like a lumbar pillow.  Switching tasks to something that requires you to be upright or walking.  Talking a brief walk.  Lying down to rest in a neutral-spine position. PROLONGED STANDING WHILE SLIGHTLY LEANING FORWARD  When completing a task that requires you to lean forward while standing in one place for a long time, place either foot up on a stationary 2-4 inch high object to help maintain the best posture. When both feet are on the ground, the low back tends to lose its slight inward curve. If this curve flattens (or becomes too large), then the back and your other joints will experience too much stress, fatigue more quickly and can cause pain.  CORRECT STANDING POSTURES Proper standing posture should be assumed with all daily activities, even if they only take a few moments, like when brushing your teeth. As in sitting, your ears should fall over your shoulders and your shoulders should fall over your hips. You should keep a slight tension in your abdominal  muscles to brace your spine. Your tailbone should point down to the ground, not behind your body, resulting in an over-extended swayback posture.  INCORRECT STANDING POSTURES  Common incorrect standing postures include a forward head, locked knees and/or an excessive swayback. WALKING Walk with an upright posture. Your ears, shoulders and hips should all line-up. PROLONGED ACTIVITY IN A FLEXED POSITION When completing a task that requires you to bend forward   at your waist or lean over a low surface, try to find a way to stabilize 3 of 4 of your limbs. You can place a hand or elbow on your thigh or rest a knee on the surface you are reaching across. This will provide you more stability so that your muscles do not fatigue as quickly. By keeping your knees relaxed, or slightly bent, you will also reduce stress across your low back. CORRECT LIFTING TECHNIQUES DO :   Assume a wide stance. This will provide you more stability and the opportunity to get as close as possible to the object which you are lifting.  Tense your abdominals to brace your spine; then bend at the knees and hips. Keeping your back locked in a neutral-spine position, lift using your leg muscles. Lift with your legs, keeping your back straight.  Test the weight of unknown objects before attempting to lift them.  Try to keep your elbows locked down at your sides in order get the best strength from your shoulders when carrying an object.  Always ask for help when lifting heavy or awkward objects. INCORRECT LIFTING TECHNIQUES DO NOT:   Lock your knees when lifting, even if it is a small object.  Bend and twist. Pivot at your feet or move your feet when needing to change directions.  Assume that you cannot safely pick up a paperclip without proper posture.   This information is not intended to replace advice given to you by your health care provider. Make sure you discuss any questions you have with your health care provider.     Document Released: 10/09/2005 Document Revised: 02/23/2015 Document Reviewed: 01/21/2009 [employer] Patient Education 2016 [employer]  

## 2015-11-30 ENCOUNTER — Ambulatory Visit (INDEPENDENT_AMBULATORY_CARE_PROVIDER_SITE_OTHER): Payer: BLUE CROSS/BLUE SHIELD | Admitting: Family

## 2015-11-30 ENCOUNTER — Encounter: Payer: Self-pay | Admitting: Family

## 2015-11-30 VITALS — BP 136/86 | HR 67 | Temp 97.9°F | Ht 73.0 in | Wt 168.0 lb

## 2015-11-30 DIAGNOSIS — M5442 Lumbago with sciatica, left side: Secondary | ICD-10-CM

## 2015-11-30 DIAGNOSIS — M542 Cervicalgia: Secondary | ICD-10-CM

## 2015-11-30 DIAGNOSIS — M25519 Pain in unspecified shoulder: Secondary | ICD-10-CM | POA: Diagnosis not present

## 2015-11-30 MED ORDER — NAPROXEN 500 MG PO TABS: 500.0000 mg | ORAL_TABLET | Freq: Two times a day (BID) | ORAL | Status: AC

## 2015-11-30 NOTE — Patient Instructions (Signed)
Sciatica With Rehab The sciatic nerve runs from the back down the leg and is responsible for sensation and control of the muscles in the back (posterior) side of the thigh, lower leg, and foot. Sciatica is a condition that is characterized by inflammation of this nerve.  SYMPTOMS   Signs of nerve damage, including numbness and/or weakness along the posterior side of the lower extremity.  Pain in the back of the thigh that may also travel down the leg.  Pain that worsens when sitting for long periods of time.  Occasionally, pain in the back or buttock. CAUSES  Inflammation of the sciatic nerve is the cause of sciatica. The inflammation is due to something irritating the nerve. Common sources of irritation include:  Sitting for long periods of time.  Direct trauma to the nerve.  Arthritis of the spine.  Herniated or ruptured disk.  Slipping of the vertebrae (spondylolisthesis).  Pressure from soft tissues, such as muscles or ligament-like tissue (fascia). RISK INCREASES WITH:  Sports that place pressure or stress on the spine (football or weightlifting).  Poor strength and flexibility.  Failure to warm up properly before activity.  Family history of low back pain or disk disorders.  Previous back injury or surgery.  Poor body mechanics, especially when lifting, or poor posture. PREVENTION   Warm up and stretch properly before activity.  Maintain physical fitness:  Strength, flexibility, and endurance.  Cardiovascular fitness.  Learn and use proper technique, especially with posture and lifting. When possible, have coach correct improper technique.  Avoid activities that place stress on the spine. PROGNOSIS If treated properly, then sciatica usually resolves within 6 weeks. However, occasionally surgery is necessary.  RELATED COMPLICATIONS   Permanent nerve damage, including pain, numbness, tingle, or weakness.  Chronic back pain.  Risks of surgery: infection,  bleeding, nerve damage, or damage to surrounding tissues. TREATMENT Treatment initially involves resting from any activities that aggravate your symptoms. The use of ice and medication may help reduce pain and inflammation. The use of strengthening and stretching exercises may help reduce pain with activity. These exercises may be performed at home or with referral to a therapist. A therapist may recommend further treatments, such as transcutaneous electronic nerve stimulation (TENS) or ultrasound. Your caregiver may recommend corticosteroid injections to help reduce inflammation of the sciatic nerve. If symptoms persist despite non-surgical (conservative) treatment, then surgery may be recommended. MEDICATION  If pain medication is necessary, then nonsteroidal anti-inflammatory medications, such as aspirin and ibuprofen, or other minor pain relievers, such as acetaminophen, are often recommended.  Do not take pain medication for 7 days before surgery.  Prescription pain relievers may be given if deemed necessary by your caregiver. Use only as directed and only as much as you need.  Ointments applied to the skin may be helpful.  Corticosteroid injections may be given by your caregiver. These injections should be reserved for the most serious cases, because they may only be given a certain number of times. HEAT AND COLD  Cold treatment (icing) relieves pain and reduces inflammation. Cold treatment should be applied for 10 to 15 minutes every 2 to 3 hours for inflammation and pain and immediately after any activity that aggravates your symptoms. Use ice packs or massage the area with a piece of ice (ice massage).  Heat treatment may be used prior to performing the stretching and strengthening activities prescribed by your caregiver, physical therapist, or athletic trainer. Use a heat pack or soak the injury in warm water.   SEEK MEDICAL CARE IF:  Treatment seems to offer no benefit, or the condition  worsens.  Any medications produce adverse side effects. EXERCISES  RANGE OF MOTION (ROM) AND STRETCHING EXERCISES - Sciatica Most people with sciatic will find that their symptoms worsen with either excessive bending forward (flexion) or arching at the low back (extension). The exercises which will help resolve your symptoms will focus on the opposite motion. Your physician, physical therapist or athletic trainer will help you determine which exercises will be most helpful to resolve your low back pain. Do not complete any exercises without first consulting with your clinician. Discontinue any exercises which worsen your symptoms until you speak to your clinician. If you have pain, numbness or tingling which travels down into your buttocks, leg or foot, the goal of the therapy is for these symptoms to move closer to your back and eventually resolve. Occasionally, these leg symptoms will get better, but your low back pain may worsen; this is typically an indication of progress in your rehabilitation. Be certain to be very alert to any changes in your symptoms and the activities in which you participated in the 24 hours prior to the change. Sharing this information with your clinician will allow him/her to most efficiently treat your condition. These exercises may help you when beginning to rehabilitate your injury. Your symptoms may resolve with or without further involvement from your physician, physical therapist or athletic trainer. While completing these exercises, remember:   Restoring tissue flexibility helps normal motion to return to the joints. This allows healthier, less painful movement and activity.  An effective stretch should be held for at least 30 seconds.  A stretch should never be painful. You should only feel a gentle lengthening or release in the stretched tissue. FLEXION RANGE OF MOTION AND STRETCHING EXERCISES: STRETCH - Flexion, Single Knee to Chest   Lie on a firm bed or floor  with both legs extended in front of you.  Keeping one leg in contact with the floor, bring your opposite knee to your chest. Hold your leg in place by either grabbing behind your thigh or at your knee.  Pull until you feel a gentle stretch in your low back. Hold __________ seconds.  Slowly release your grasp and repeat the exercise with the opposite side. Repeat __________ times. Complete this exercise __________ times per day.  STRETCH - Flexion, Double Knee to Chest  Lie on a firm bed or floor with both legs extended in front of you.  Keeping one leg in contact with the floor, bring your opposite knee to your chest.  Tense your stomach muscles to support your back and then lift your other knee to your chest. Hold your legs in place by either grabbing behind your thighs or at your knees.  Pull both knees toward your chest until you feel a gentle stretch in your low back. Hold __________ seconds.  Tense your stomach muscles and slowly return one leg at a time to the floor. Repeat __________ times. Complete this exercise __________ times per day.  STRETCH - Low Trunk Rotation   Lie on a firm bed or floor. Keeping your legs in front of you, bend your knees so they are both pointed toward the ceiling and your feet are flat on the floor.  Extend your arms out to the side. This will stabilize your upper body by keeping your shoulders in contact with the floor.  Gently and slowly drop both knees together to one side until   you feel a gentle stretch in your low back. Hold for __________ seconds.  Tense your stomach muscles to support your low back as you bring your knees back to the starting position. Repeat the exercise to the other side. Repeat __________ times. Complete this exercise __________ times per day  EXTENSION RANGE OF MOTION AND FLEXIBILITY EXERCISES: STRETCH - Extension, Prone on Elbows  Lie on your stomach on the floor, a bed will be too soft. Place your palms about shoulder  width apart and at the height of your head.  Place your elbows under your shoulders. If this is too painful, stack pillows under your chest.  Allow your body to relax so that your hips drop lower and make contact more completely with the floor.  Hold this position for __________ seconds.  Slowly return to lying flat on the floor. Repeat __________ times. Complete this exercise __________ times per day.  RANGE OF MOTION - Extension, Prone Press Ups  Lie on your stomach on the floor, a bed will be too soft. Place your palms about shoulder width apart and at the height of your head.  Keeping your back as relaxed as possible, slowly straighten your elbows while keeping your hips on the floor. You may adjust the placement of your hands to maximize your comfort. As you gain motion, your hands will come more underneath your shoulders.  Hold this position __________ seconds.  Slowly return to lying flat on the floor. Repeat __________ times. Complete this exercise __________ times per day.  STRENGTHENING EXERCISES - Sciatica  These exercises may help you when beginning to rehabilitate your injury. These exercises should be done near your "sweet spot." This is the neutral, low-back arch, somewhere between fully rounded and fully arched, that is your least painful position. When performed in this safe range of motion, these exercises can be used for people who have either a flexion or extension based injury. These exercises may resolve your symptoms with or without further involvement from your physician, physical therapist or athletic trainer. While completing these exercises, remember:   Muscles can gain both the endurance and the strength needed for everyday activities through controlled exercises.  Complete these exercises as instructed by your physician, physical therapist or athletic trainer. Progress with the resistance and repetition exercises only as your caregiver advises.  You may  experience muscle soreness or fatigue, but the pain or discomfort you are trying to eliminate should never worsen during these exercises. If this pain does worsen, stop and make certain you are following the directions exactly. If the pain is still present after adjustments, discontinue the exercise until you can discuss the trouble with your clinician. STRENGTHENING - Deep Abdominals, Pelvic Tilt   Lie on a firm bed or floor. Keeping your legs in front of you, bend your knees so they are both pointed toward the ceiling and your feet are flat on the floor.  Tense your lower abdominal muscles to press your low back into the floor. This motion will rotate your pelvis so that your tail bone is scooping upwards rather than pointing at your feet or into the floor.  With a gentle tension and even breathing, hold this position for __________ seconds. Repeat __________ times. Complete this exercise __________ times per day.  STRENGTHENING - Abdominals, Crunches   Lie on a firm bed or floor. Keeping your legs in front of you, bend your knees so they are both pointed toward the ceiling and your feet are flat on the   floor. Cross your arms over your chest.  Slightly tip your chin down without bending your neck.  Tense your abdominals and slowly lift your trunk high enough to just clear your shoulder blades. Lifting higher can put excessive stress on the low back and does not further strengthen your abdominal muscles.  Control your return to the starting position. Repeat __________ times. Complete this exercise __________ times per day.  STRENGTHENING - Quadruped, Opposite UE/LE Lift  Assume a hands and knees position on a firm surface. Keep your hands under your shoulders and your knees under your hips. You may place padding under your knees for comfort.  Find your neutral spine and gently tense your abdominal muscles so that you can maintain this position. Your shoulders and hips should form a rectangle  that is parallel with the floor and is not twisted.  Keeping your trunk steady, lift your right hand no higher than your shoulder and then your left leg no higher than your hip. Make sure you are not holding your breath. Hold this position __________ seconds.  Continuing to keep your abdominal muscles tense and your back steady, slowly return to your starting position. Repeat with the opposite arm and leg. Repeat __________ times. Complete this exercise __________ times per day.  STRENGTHENING - Abdominals and Quadriceps, Straight Leg Raise   Lie on a firm bed or floor with both legs extended in front of you.  Keeping one leg in contact with the floor, bend the other knee so that your foot can rest flat on the floor.  Find your neutral spine, and tense your abdominal muscles to maintain your spinal position throughout the exercise.  Slowly lift your straight leg off the floor about 6 inches for a count of 15, making sure to not hold your breath.  Still keeping your neutral spine, slowly lower your leg all the way to the floor. Repeat this exercise with each leg __________ times. Complete this exercise __________ times per day. POSTURE AND BODY MECHANICS CONSIDERATIONS - Sciatica Keeping correct posture when sitting, standing or completing your activities will reduce the stress put on different body tissues, allowing injured tissues a chance to heal and limiting painful experiences. The following are general guidelines for improved posture. Your physician or physical therapist will provide you with any instructions specific to your needs. While reading these guidelines, remember:  The exercises prescribed by your provider will help you have the flexibility and strength to maintain correct postures.  The correct posture provides the optimal environment for your joints to work. All of your joints have less wear and tear when properly supported by a spine with good posture. This means you will  experience a healthier, less painful body.  Correct posture must be practiced with all of your activities, especially prolonged sitting and standing. Correct posture is as important when doing repetitive low-stress activities (typing) as it is when doing a single heavy-load activity (lifting). RESTING POSITIONS Consider which positions are most painful for you when choosing a resting position. If you have pain with flexion-based activities (sitting, bending, stooping, squatting), choose a position that allows you to rest in a less flexed posture. You would want to avoid curling into a fetal position on your side. If your pain worsens with extension-based activities (prolonged standing, working overhead), avoid resting in an extended position such as sleeping on your stomach. Most people will find more comfort when they rest with their spine in a more neutral position, neither too rounded nor too   arched. Lying on a non-sagging bed on your side with a pillow between your knees, or on your back with a pillow under your knees will often provide some relief. Keep in mind, being in any one position for a prolonged period of time, no matter how correct your posture, can still lead to stiffness. PROPER SITTING POSTURE In order to minimize stress and discomfort on your spine, you must sit with correct posture Sitting with good posture should be effortless for a healthy body. Returning to good posture is a gradual process. Many people can work toward this most comfortably by using various supports until they have the flexibility and strength to maintain this posture on their own. When sitting with proper posture, your ears will fall over your shoulders and your shoulders will fall over your hips. You should use the back of the chair to support your upper back. Your low back will be in a neutral position, just slightly arched. You may place a small pillow or folded towel at the base of your low back for support.  When  working at a desk, create an environment that supports good, upright posture. Without extra support, muscles fatigue and lead to excessive strain on joints and other tissues. Keep these recommendations in mind: CHAIR:   A chair should be able to slide under your desk when your back makes contact with the back of the chair. This allows you to work closely.  The chair's height should allow your eyes to be level with the upper part of your monitor and your hands to be slightly lower than your elbows. BODY POSITION  Your feet should make contact with the floor. If this is not possible, use a foot rest.  Keep your ears over your shoulders. This will reduce stress on your neck and low back. INCORRECT SITTING POSTURES   If you are feeling tired and unable to assume a healthy sitting posture, do not slouch or slump. This puts excessive strain on your back tissues, causing more damage and pain. Healthier options include:  Using more support, like a lumbar pillow.  Switching tasks to something that requires you to be upright or walking.  Talking a brief walk.  Lying down to rest in a neutral-spine position. PROLONGED STANDING WHILE SLIGHTLY LEANING FORWARD  When completing a task that requires you to lean forward while standing in one place for a long time, place either foot up on a stationary 2-4 inch high object to help maintain the best posture. When both feet are on the ground, the low back tends to lose its slight inward curve. If this curve flattens (or becomes too large), then the back and your other joints will experience too much stress, fatigue more quickly and can cause pain.  CORRECT STANDING POSTURES Proper standing posture should be assumed with all daily activities, even if they only take a few moments, like when brushing your teeth. As in sitting, your ears should fall over your shoulders and your shoulders should fall over your hips. You should keep a slight tension in your abdominal  muscles to brace your spine. Your tailbone should point down to the ground, not behind your body, resulting in an over-extended swayback posture.  INCORRECT STANDING POSTURES  Common incorrect standing postures include a forward head, locked knees and/or an excessive swayback. WALKING Walk with an upright posture. Your ears, shoulders and hips should all line-up. PROLONGED ACTIVITY IN A FLEXED POSITION When completing a task that requires you to bend forward   at your waist or lean over a low surface, try to find a way to stabilize 3 of 4 of your limbs. You can place a hand or elbow on your thigh or rest a knee on the surface you are reaching across. This will provide you more stability so that your muscles do not fatigue as quickly. By keeping your knees relaxed, or slightly bent, you will also reduce stress across your low back. CORRECT LIFTING TECHNIQUES DO :   Assume a wide stance. This will provide you more stability and the opportunity to get as close as possible to the object which you are lifting.  Tense your abdominals to brace your spine; then bend at the knees and hips. Keeping your back locked in a neutral-spine position, lift using your leg muscles. Lift with your legs, keeping your back straight.  Test the weight of unknown objects before attempting to lift them.  Try to keep your elbows locked down at your sides in order get the best strength from your shoulders when carrying an object.  Always ask for help when lifting heavy or awkward objects. INCORRECT LIFTING TECHNIQUES DO NOT:   Lock your knees when lifting, even if it is a small object.  Bend and twist. Pivot at your feet or move your feet when needing to change directions.  Assume that you cannot safely pick up a paperclip without proper posture.   This information is not intended to replace advice given to you by your health care provider. Make sure you discuss any questions you have with your health care provider.     Document Released: 10/09/2005 Document Revised: 02/23/2015 Document Reviewed: 01/21/2009 [employer] Interactive Patient Education 2016 [employer]  

## 2015-11-30 NOTE — Progress Notes (Signed)
   Subjective:    Patient ID: Jared Dickerson, male    DOB: 08-02-1991, 25 y.o.   MRN: [id number]  HPI Pt presents to the office today to recheck left neck/shoulder pain and lower back pain with left sciatic pain. PT was given naproxen and flexeril. PT states he has been going to his chiropractor and that seem to relieve the sciatic pain, but continues to have  Intermittent soreness 4-5 out 10.   Review of Systems  Constitutional: Negative.   HENT: Negative.   Respiratory: Negative.   Cardiovascular: Negative.   Gastrointestinal: Negative.   Endocrine: Negative.   Genitourinary: Negative.   Musculoskeletal: Negative.   Neurological: Negative.   Hematological: Negative.   Psychiatric/Behavioral: Negative.   All other systems reviewed and are negative.      Objective:   Physical Exam  Constitutional: He is oriented to person, place, and time. He appears well-developed and well-nourished. No distress.  HENT:  Head: Normocephalic.  Right Ear: External ear normal.  Left Ear: External ear normal.  Mouth/Throat: Oropharynx is clear and moist.  Eyes: Pupils are equal, round, and reactive to light. Right eye exhibits no discharge. Left eye exhibits no discharge.  Neck: Normal range of motion. Neck supple. No thyromegaly present.  Cardiovascular: Normal rate, regular rhythm, normal heart sounds and intact distal pulses.   No murmur heard. Pulmonary/Chest: Effort normal and breath sounds normal. No respiratory distress. He has no wheezes.  Abdominal: Soft. Bowel sounds are normal. He exhibits no distension. There is no tenderness.  Musculoskeletal: Normal range of motion. He exhibits no edema or tenderness.  Neurological: He is alert and oriented to person, place, and time. He has normal reflexes. No cranial nerve deficit.  Skin: Skin is warm and dry. No rash noted. No erythema.  Psychiatric: He has a normal mood and affect. His behavior is normal. Judgment and thought content normal.    Vitals reviewed.   BP 136/86 mmHg  Pulse 67  Temp(Src) 97.9 F (36.6 C) (Oral)  Ht  (1.854 m)  Wt 168 lb (76.204 kg)  BMI 22.17 kg/m2       Assessment & Plan:  1. Left-sided low back pain with left-sided sciatica -Continue with Chiropractor -Rest -Ice -Continue naprosyn and flexeril as needed  - naproxen (NAPROSYN) 500 MG tablet; Take 1 tablet (500 mg total) by mouth 2 (two) times daily with a meal.  Dispense: 60 tablet; Refill: 1  2. Neck pain --Continue with Chiropractor -Rest -Ice -Continue naprosyn and flexeril as needed  - naproxen (NAPROSYN) 500 MG tablet; Take 1 tablet (500 mg total) by mouth 2 (two) times daily with a meal.  Dispense: 60 tablet; Refill: 1  3. Shoulder pain, unspecified laterality -Continue with Chiropractor -Rest -Ice -Continue naprosyn and flexeril as needed  - naproxen (NAPROSYN) 500 MG tablet; Take 1 tablet (500 mg total) by mouth 2 (two) times daily with a meal.  Dispense: 60 tablet; Refill: 1  Jannifer Rodney, FNP

## 2016-09-01 IMAGING — CR DG SHOULDER 2+V*L*
3 series · 3 of 3 positions shown · non-contrast
Comparison: None.

CLINICAL DATA: Four wheeling injury.  Left shoulder pain

EXAM:
LEFT SHOULDER - 2+ VIEW

[view not recorded (1 of 3)]
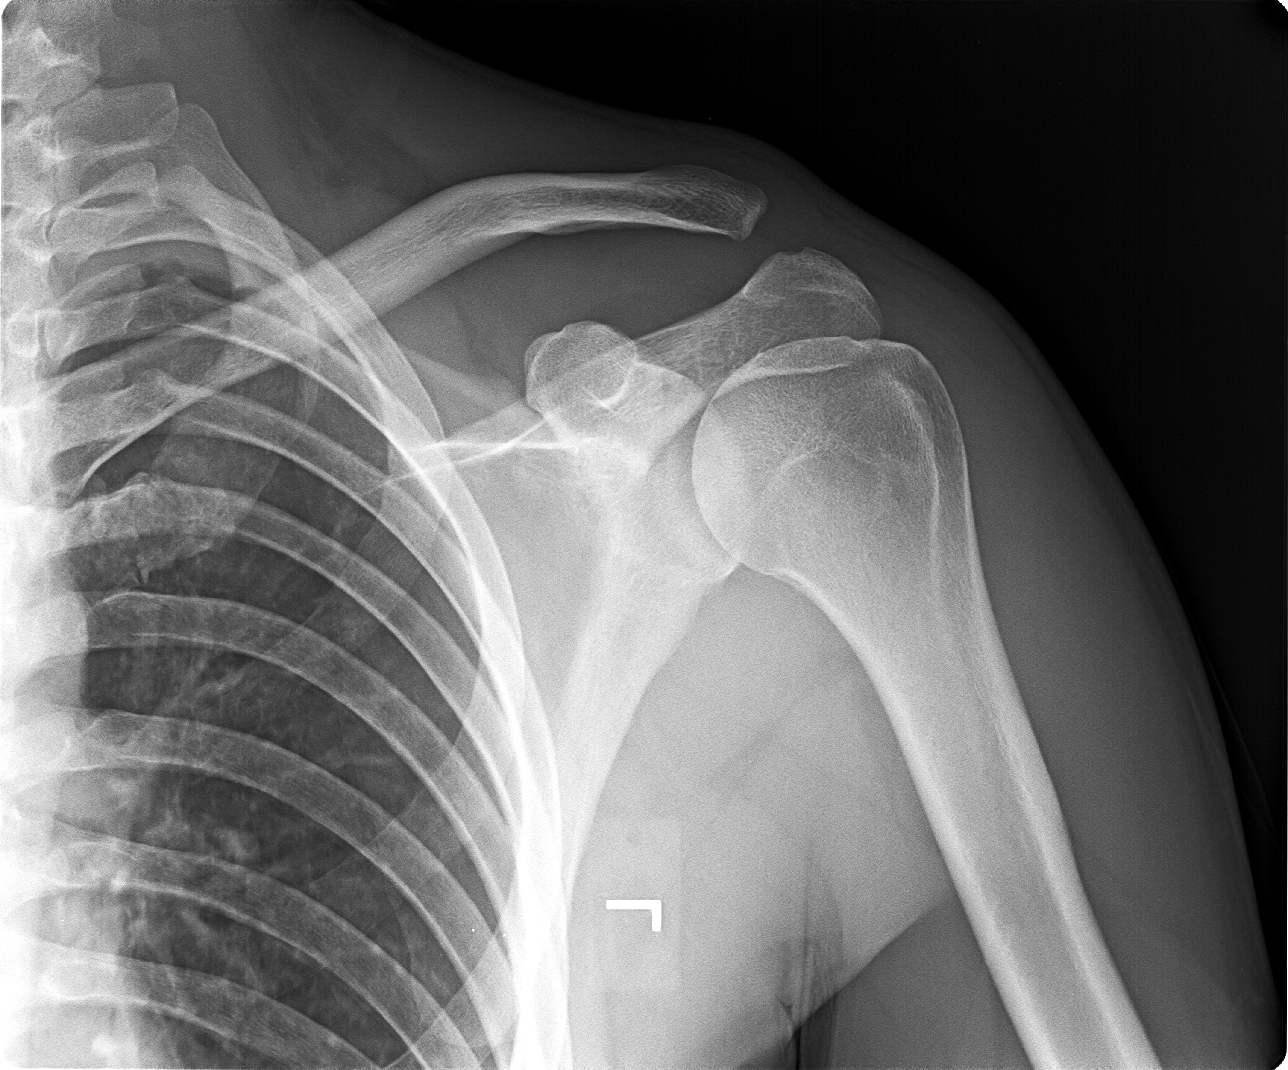

[view not recorded (2 of 3)]
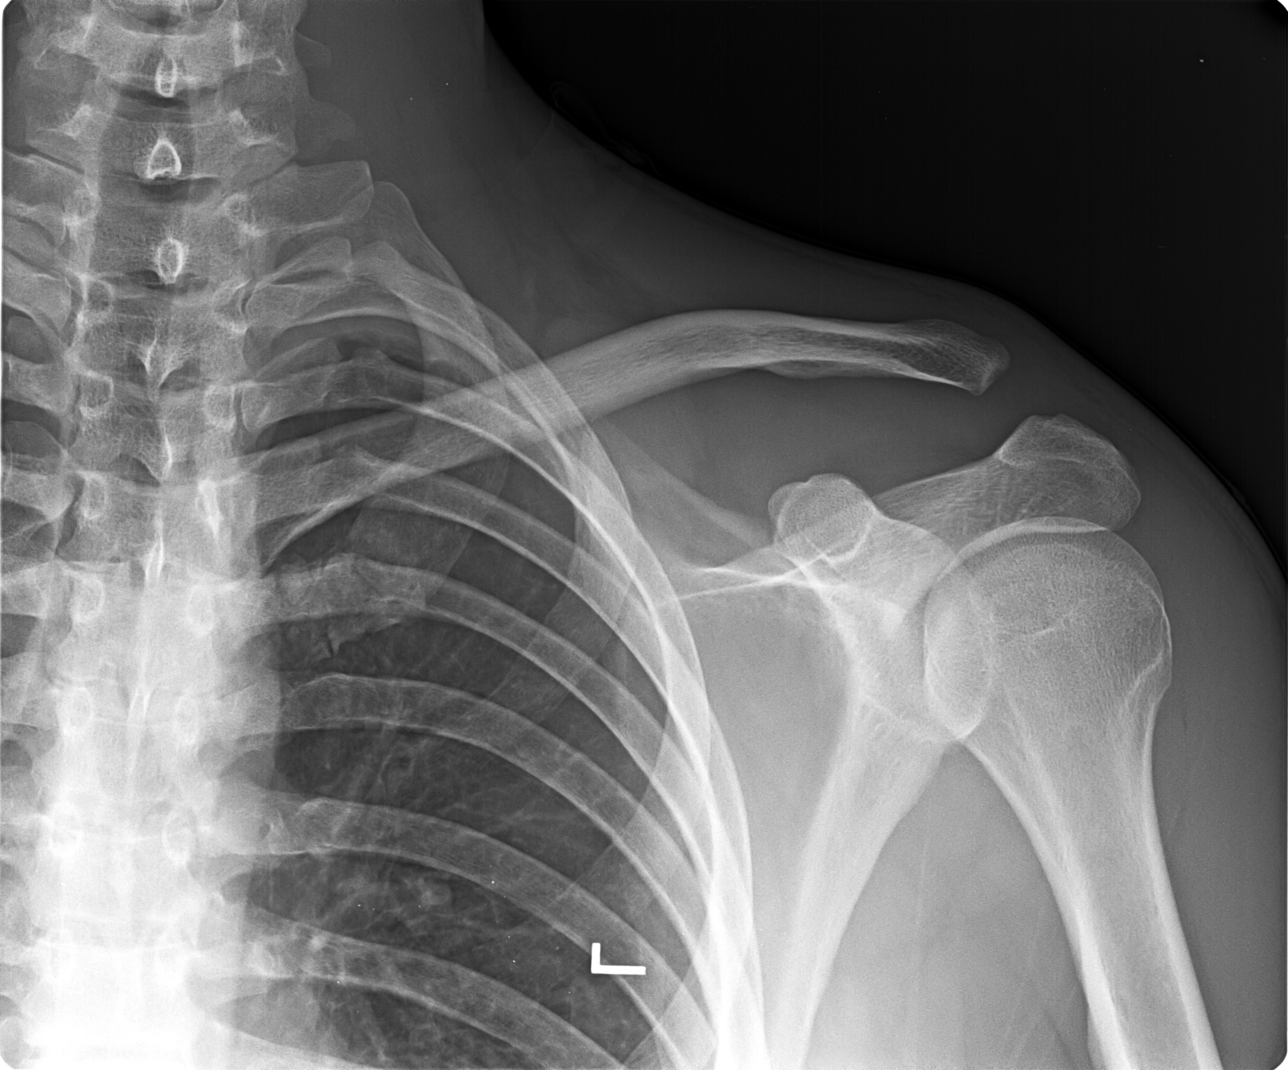

[view not recorded (3 of 3)]
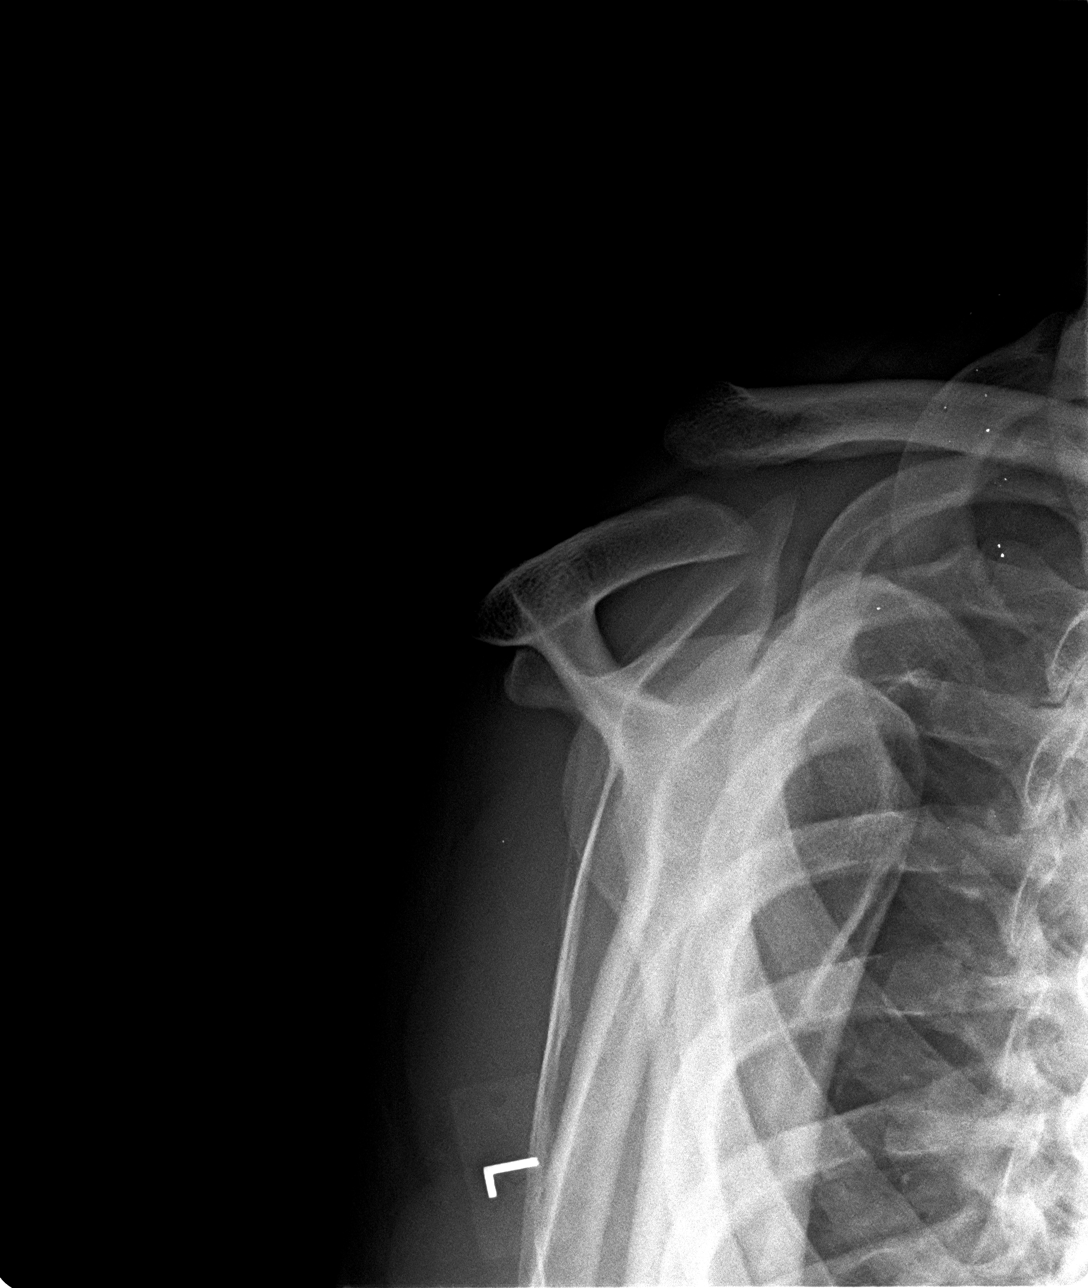

[3 of 3 positions shown; findings below may reference images not displayed]

FINDINGS: AC separation. The clavicle is displaced superiorly and there is
widening of the AC joint. There is probable disruption of the
cortical clavicular joint indicating grade 3 AC separation.

Negative for fracture.
IMPRESSION: Grade 3 AC separation.  Negative for fracture.
